# Patient Record
Sex: Female | Born: 1960 | Race: White | Hispanic: No | Smoking: Never smoker
Health system: Southern US, Community
[De-identification: ages and names within clinical notes are randomized; demographics above are authoritative.]

## PROBLEM LIST (undated history)

## (undated) NOTE — Telephone Encounter (Signed)
 Formatting of this note might be different from the original. Patient was notified. Patient verbalized understanding. Electronically signed by Ralph Larve, CCMA at 10/08/2023  5:07 PM CDT

## (undated) NOTE — Telephone Encounter (Signed)
 Formatting of this note might be different from the original. Her last Tdap was in 2019. No need for booster.  Electronically signed by Gaines Salmons, DO at 10/08/2023  5:05 PM CDT

## (undated) NOTE — Telephone Encounter (Signed)
 Formatting of this note might be different from the original.   Patient called to inquire about her last tetanus immunization. Pt noted that she stepped on a rusty nail. Made a decent puncture wound. Patient is needed to know if that last tetanus she received is still okay or would she need another.  Electronically signed by Sena Laymon SAILOR. at 10/08/2023 12:19 PM CDT

---

## 2021-06-14 ENCOUNTER — Encounter (HOSPITAL_BASED_OUTPATIENT_CLINIC_OR_DEPARTMENT_OTHER): Payer: Self-pay

## 2021-06-15 ENCOUNTER — Telehealth (HOSPITAL_BASED_OUTPATIENT_CLINIC_OR_DEPARTMENT_OTHER): Payer: Self-pay

## 2021-06-15 NOTE — Telephone Encounter (Signed)
Called patient and left message regarding medical records for upcoming appointment on 07/13/21 with J.Cleaver

## 2021-06-15 NOTE — Telephone Encounter (Signed)
Pt returned called stated that the records were on site, Check with medical records in [place] and [facility], records are not found. Will call patient back to obtain contact information from previous medical facility to request records..  Reach out to the PT obtained the referring physician office for Dr. Venetia Maxon at [facility] at [phone], spoke with Drucilla Schmidt she will fax medical records to [phone]   Spoke with PT she is aware of the above information.

## 2021-06-27 ENCOUNTER — Other Ambulatory Visit: Payer: Self-pay

## 2021-06-27 ENCOUNTER — Ambulatory Visit (HOSPITAL_BASED_OUTPATIENT_CLINIC_OR_DEPARTMENT_OTHER): Payer: Managed Care, Other (non HMO) | Admitting: Cardiology

## 2021-06-27 ENCOUNTER — Encounter (HOSPITAL_BASED_OUTPATIENT_CLINIC_OR_DEPARTMENT_OTHER): Payer: Self-pay | Admitting: Cardiology

## 2021-06-27 VITALS — BP 142/78 | HR 93 | Ht 68.0 in | Wt 171.8 lb

## 2021-06-27 DIAGNOSIS — Z7189 Other specified counseling: Secondary | ICD-10-CM | POA: Diagnosis not present

## 2021-06-27 DIAGNOSIS — I10 Essential (primary) hypertension: Secondary | ICD-10-CM

## 2021-06-27 DIAGNOSIS — R002 Palpitations: Secondary | ICD-10-CM

## 2021-06-27 DIAGNOSIS — E785 Hyperlipidemia, unspecified: Secondary | ICD-10-CM

## 2021-06-27 NOTE — Patient Instructions (Signed)

## 2021-06-27 NOTE — Progress Notes (Incomplete)
Cardiology Office Note:    Date:  06/27/2021   ID:  Amedeo Kinsman, DOB [birth year], MRN [id number]  PCP:  Jola Baptist, PA-C  Cardiologist:  Buford Dresser, MD  Referring MD: No ref. provider found   No chief complaint on file.  History of Present Illness:    Lindsey Brooks is a 61 y.o. female with a hx of hypertension. She presents today to establish care. She was previously followed by Dr. Lorretta Harp at [facility] in [place], [place].  Cardiovascular risk factors: Prior clinical ASCVD:  Comorbid conditions: Hypertension since she was 61 yo. She does not monitor her blood pressure at home. Her blood pressure was 128/74 at a recent visit with her PCP. Metabolic syndrome/Obesity: Chronic inflammatory conditions: Tobacco use history: Family history: Father had a heart attack. Brother had a stroke in his early 36's.  Prior cardiac testing and/or incidental findings on other testing (ie coronary calcium): Exercise level: Currently works in a building with 4 stories, climbs stairs frequently. Her prior job was less active. Current diet:  On 06/19/2020 she felt several "twinges" of pain in her chest that are very difficult to describe. Prior to that day this chest pain had not occurred for a while.  Also, she notes her heart rate is typically high, and has noticed her pulse feels irregular at times. She notes that she generally feels better while taking metoprolol. 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 When she did she would usually see sinus tachycardia.  She recently went through a stressful divorce. Her palpitations have been known to be triggered by stress.  She also complains of diaphoresis with minimal exertion. She believes this may be due to being sensitive to humidity. When climbing stairs she is not as diaphoretic as following showers.  She denies any shortness of breath. No lightheadedness, headaches, syncope, orthopnea, PND, or lower  extremity edema or exertional symptoms.  History reviewed. No pertinent past medical history.  History reviewed. No pertinent surgical history.  Current Medications: Current Outpatient Medications on File Prior to Visit  Medication Sig   amlodipine-atorvastatin (CADUET) 10-10 MG tablet Take 1 tablet by mouth daily.   aspirin 81 MG EC tablet daily.   Calcium Carb-Cholecalciferol 500-10 MG-MCG CHEW Calcium 500 + D  OTC - ONCE DAILY   Docusate Sodium (DSS) 100 MG CAPS Take by mouth.   fluticasone (FLONASE) 50 MCG/ACT nasal spray daily.   loratadine (CLARITIN) 10 MG tablet Take by mouth.   losartan (COZAAR) 100 MG tablet Take 100 mg by mouth daily.   metoprolol succinate (TOPROL-XL) 25 MG 24 hr tablet Take 25 mg by mouth daily.   Multiple Vitamin (MULTIVITAMIN PO) Multi Vitamin  OTC ONCE DAILY   SAVELLA 100 MG TABS tablet Take 100 mg by mouth daily.   No current facility-administered medications on file prior to visit.     Allergies:   Bupropion, Penicillins, and Pseudoephedrine   Social History   Tobacco Use   Smoking status: Never    Passive exposure: Never   Smokeless tobacco: Never    Family History: family history is not on file.  ROS:   Please see the history of present illness.  Additional pertinent ROS: Constitutional: Negative for chills, fever, night sweats, unintentional weight loss  HENT: Negative for ear pain and hearing loss.   Eyes: Negative for loss of vision and eye pain.  Respiratory: Negative for cough, sputum, wheezing.   Cardiovascular: See HPI. Gastrointestinal: Negative for abdominal pain,  melena, and hematochezia.  Genitourinary: Negative for dysuria and hematuria.  Musculoskeletal: Negative for falls and myalgias.  Skin: Negative for itching and rash.  Neurological: Negative for focal weakness, focal sensory changes and loss of consciousness. Positive for stress. Endo/Heme/Allergies: Does bruise/bleed easily.     EKGs/Labs/Other Studies  Reviewed:    The following studies were reviewed today:  Echo*** Mild mitral regurgitation.  Zio Monitor*** Two episodes of SVT.  Stress Test 01/24/2021 ([facility])*** 98% target heart rate.  EKG:  EKG is personally reviewed.   06/27/2021: ***  Recent Labs: No results found for requested labs within last 8760 hours.   Recent Lipid Panel No results found for: CHOL, TRIG, HDL, CHOLHDL, VLDL, LDLCALC, LDLDIRECT  Physical Exam:    VS:  BP (!) 142/78 (BP Location: Left Arm, Patient Position: Sitting, Cuff Size: Normal)    Pulse 93    Ht 5\' 8"  (1.727 m)    Wt 171 lb 12.8 oz (77.9 kg)    LMP  (LMP Unknown)    SpO2 98%    BMI 26.12 kg/m     Wt Readings from Last 3 Encounters:  06/27/21 171 lb 12.8 oz (77.9 kg)    GEN: Well nourished, well developed in no acute distress HEENT: Normal, moist mucous membranes NECK: No JVD CARDIAC: regular rhythm, normal S1 and S2, no rubs or gallops. No murmur. VASCULAR: Radial and DP pulses 2+ bilaterally. No carotid bruits RESPIRATORY:  Clear to auscultation without rales, wheezing or rhonchi  ABDOMEN: Soft, non-tender, non-distended MUSCULOSKELETAL:  Ambulates independently SKIN: Warm and dry, no edema NEUROLOGIC:  Alert and oriented x 3. No focal neuro deficits noted. PSYCHIATRIC:  Normal affect    ASSESSMENT:    1. Palpitation    PLAN:     Cardiac risk counseling and prevention recommendations: -recommend heart healthy/Mediterranean diet, with whole grains, fruits, vegetable, fish, lean meats, nuts, and olive oil. Limit salt. -recommend moderate walking, 3-5 times/week for 30-50 minutes each session. Aim for at least 150 minutes.week. Goal should be pace of 3 miles/hours, or walking 1.5 miles in 30 minutes -recommend avoidance of tobacco products. Avoid excess alcohol. -ASCVD risk score: The 10-year ASCVD risk score (Arnett DK, et al., 2019) is: 3.9%   Values used to calculate the score:     Age: 33 years     Sex: Female     Is  Non-Hispanic African American: No     Diabetic: No     Tobacco smoker: No     Systolic Blood Pressure: [id number] mmHg     Is BP treated: Yes     HDL Cholesterol: 86 mg/dL     Total Cholesterol: 191 mg/dL    Plan for follow up: 1 year or sooner as needed.  Buford Dresser, MD, PhD, [facility]    Medication Adjustments/Labs and Tests Ordered: Current medicines are reviewed at length with the patient today.  Concerns regarding medicines are outlined above.   Orders Placed This Encounter  Procedures   EKG 12-Lead   No orders of the defined types were placed in this encounter.  There are no Patient Instructions on file for this visit.   I,Mathew Stumpf,acting as a Education administrator for [employer], MD.,have documented all relevant documentation on the behalf of Buford Dresser, MD,as directed by  Buford Dresser, MD while in the presence of Buford Dresser, MD.  ***  Signed, Buford Dresser, MD PhD 06/27/2021 4:35 PM    [place]

## 2021-07-15 ENCOUNTER — Encounter (HOSPITAL_BASED_OUTPATIENT_CLINIC_OR_DEPARTMENT_OTHER): Payer: Self-pay

## 2021-10-26 ENCOUNTER — Other Ambulatory Visit: Payer: Self-pay | Admitting: Physician Assistant

## 2021-10-26 DIAGNOSIS — Z1231 Encounter for screening mammogram for malignant neoplasm of breast: Secondary | ICD-10-CM

## 2021-11-02 ENCOUNTER — Ambulatory Visit
Admission: RE | Admit: 2021-11-02 | Discharge: 2021-11-02 | Disposition: A | Discharge status: Home / Self Care | Payer: Managed Care, Other (non HMO) | Source: Ambulatory Visit | Attending: Physician Assistant | Admitting: Physician Assistant

## 2021-11-02 DIAGNOSIS — Z1231 Encounter for screening mammogram for malignant neoplasm of breast: Secondary | ICD-10-CM

## 2022-10-06 ENCOUNTER — Encounter: Payer: Self-pay | Admitting: Physician Assistant

## 2022-10-06 DIAGNOSIS — Z0000 Encounter for general adult medical examination without abnormal findings: Secondary | ICD-10-CM

## 2023-05-14 IMAGING — MG MM DIGITAL SCREENING BILAT W/ TOMO AND CAD
8 series · 8 of 24 positions shown · non-contrast
Comparison: Outside priors have been requested.

CLINICAL DATA: Screening.

EXAM:
DIGITAL SCREENING BILATERAL MAMMOGRAM WITH TOMOSYNTHESIS AND CAD
TECHNIQUE: Bilateral screening digital craniocaudal and mediolateral oblique
mammograms were obtained. Bilateral screening digital breast
tomosynthesis was performed. The images were evaluated with
computer-aided detection.

[R CC synth-2D]
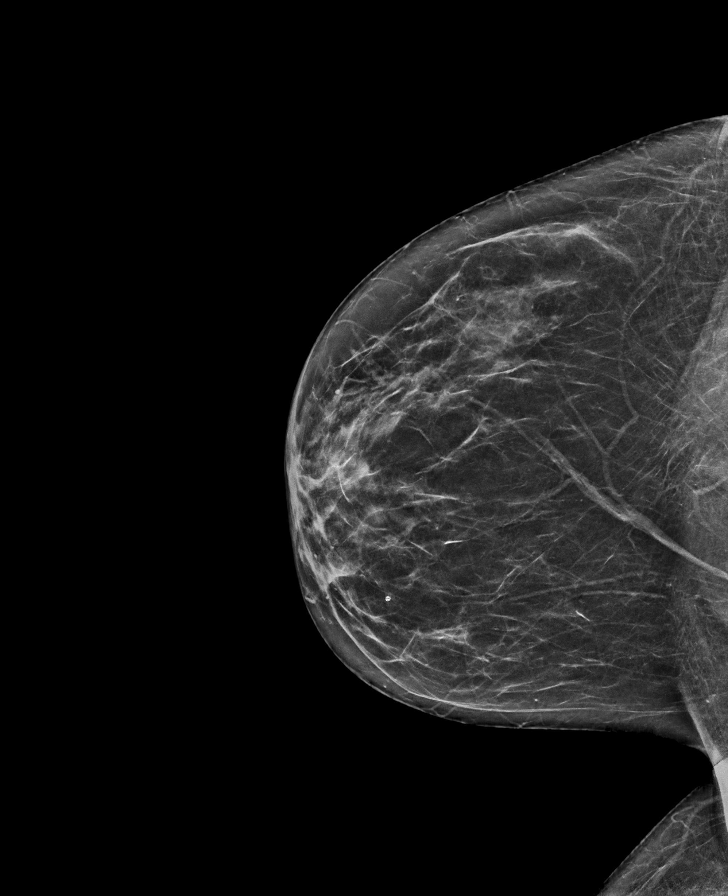

[L CC synth-2D]
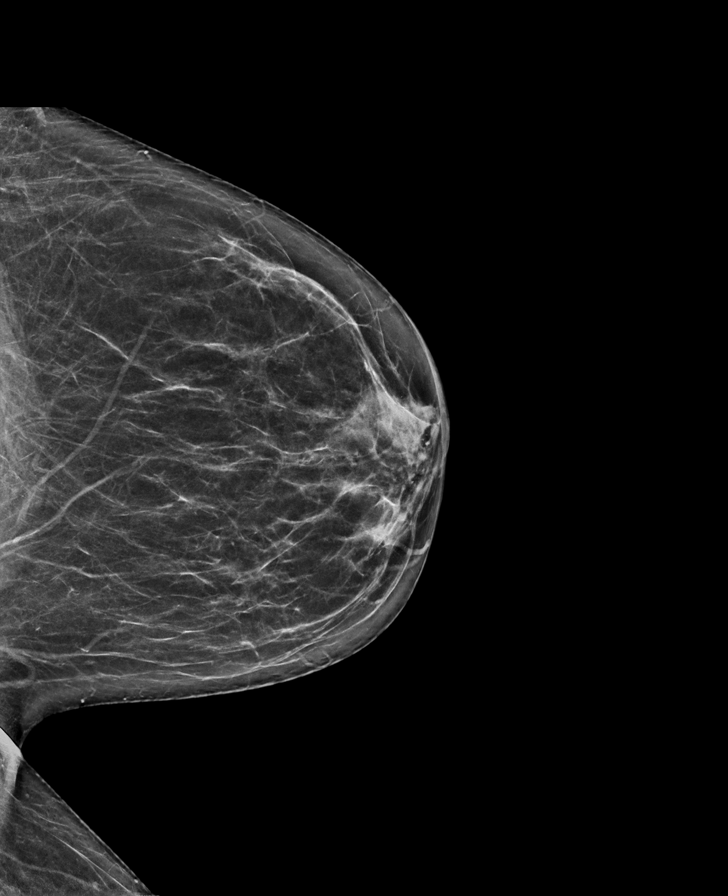

[R MLO synth-2D]
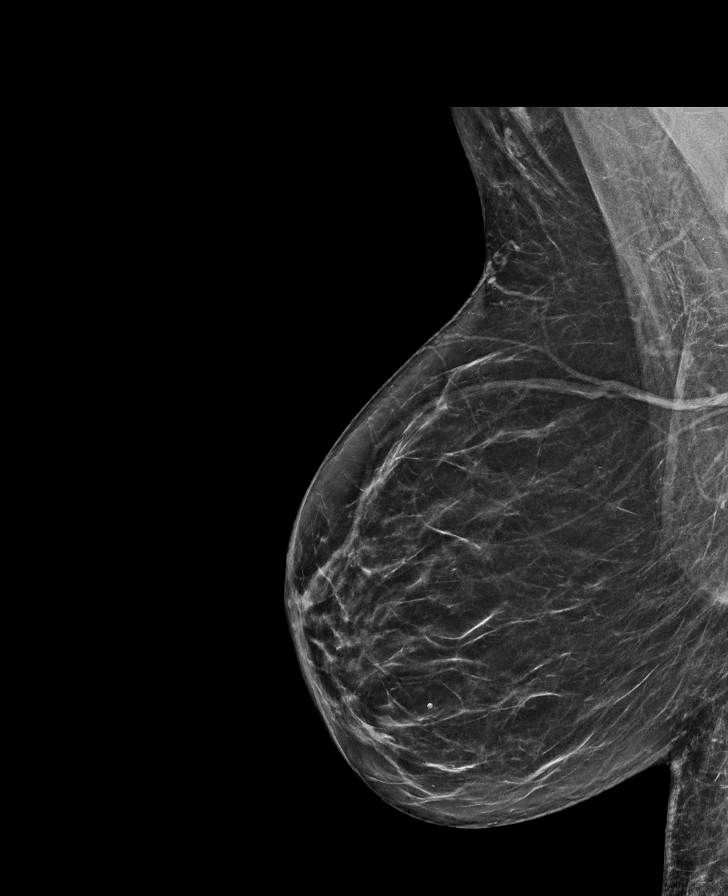

[L MLO synth-2D]
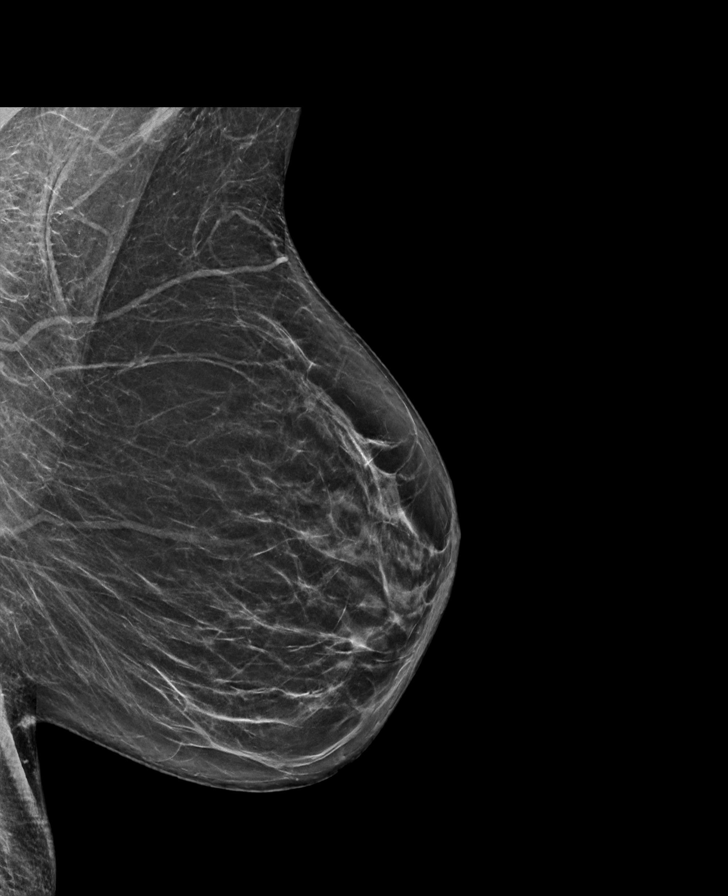

[L CC tomo · tomo slice 33/65.0]
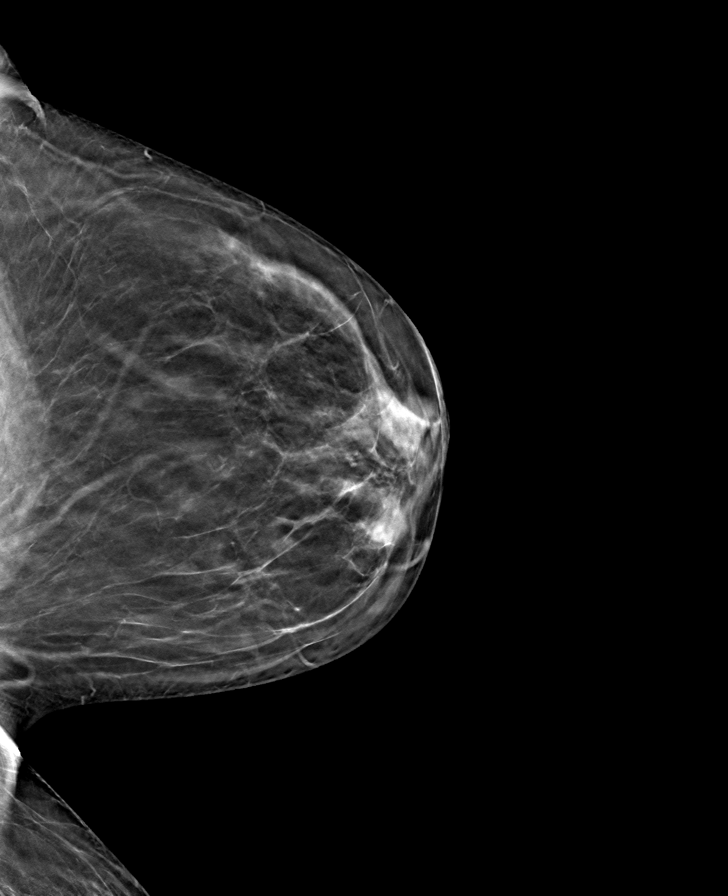

[L MLO tomo · tomo slice 35/68.0]
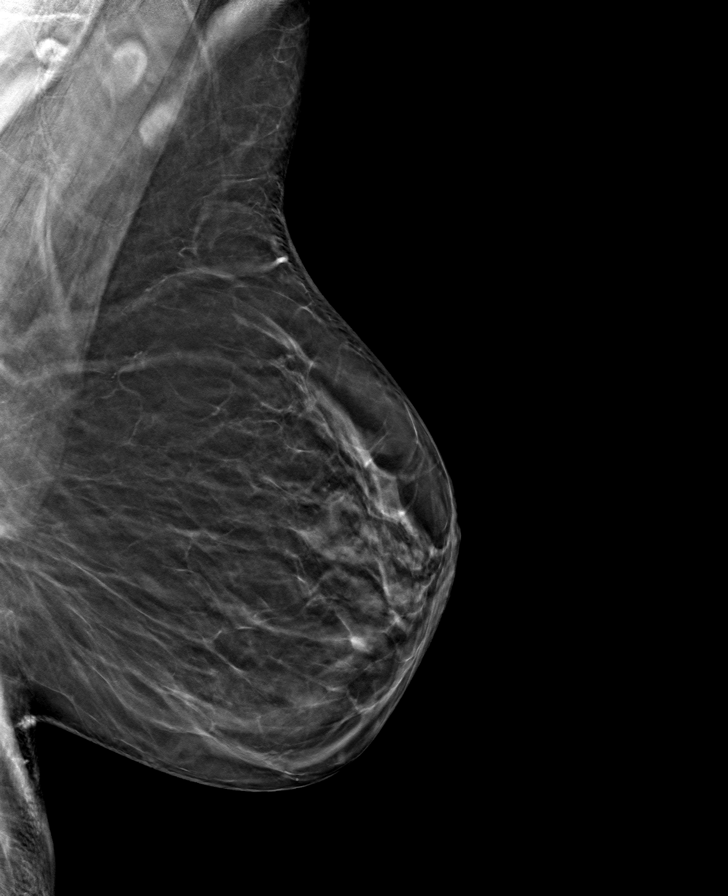

[R CC tomo · tomo slice 32/63.0]
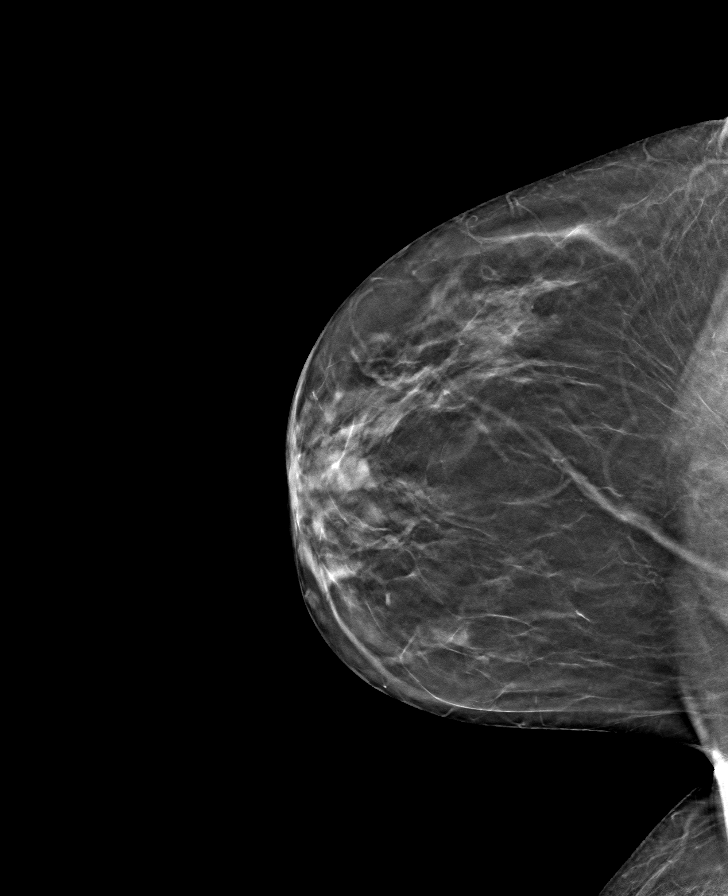

[R MLO tomo · tomo slice 35/69.0]
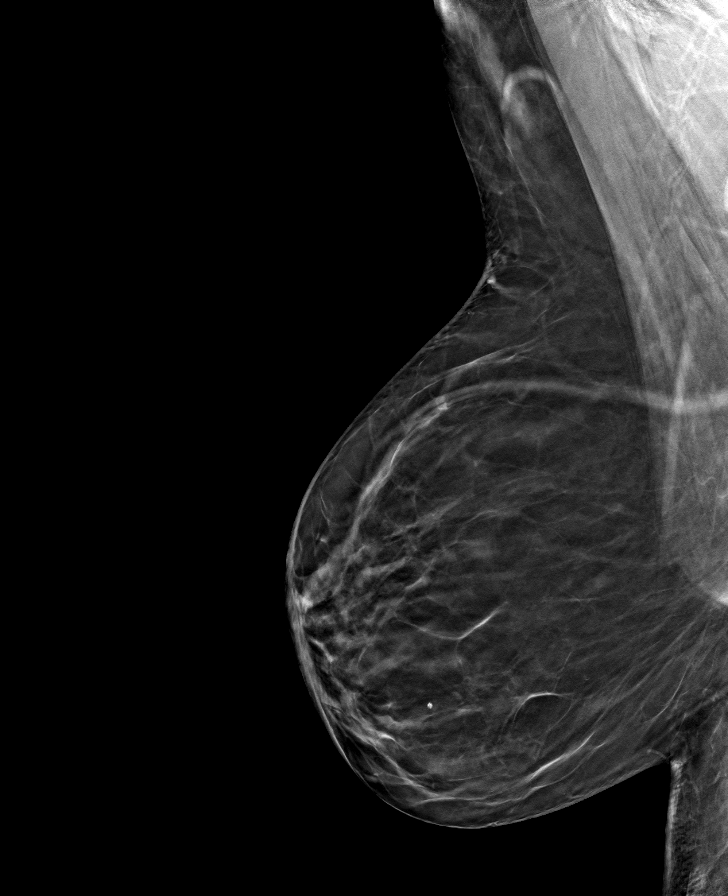

[8 of 24 positions shown; findings below may reference images not displayed]

If they are
obtained, an addendum will be issued.

ACR Breast Density Category b: There are scattered areas of
fibroglandular density.
FINDINGS: There are no findings suspicious for malignancy.
IMPRESSION: No mammographic evidence of malignancy. A result letter of this
screening mammogram will be mailed directly to the patient.

RECOMMENDATION:
Screening mammogram in one year. (Code:AN-0-O82)

BI-RADS CATEGORY  1: Negative.

## 2024-06-03 ENCOUNTER — Telehealth: Payer: Self-pay | Admitting: Cardiology

## 2024-06-03 NOTE — Telephone Encounter (Signed)
 Patient says she is being seen by [facility] and they will need her cardiology records from [facility]. Patient would like a call back with updates.  [facility] Fax#: [phone]
